# Patient Record
Sex: Female | Born: 1990 | Race: Black or African American | Hispanic: No | Marital: Single | Smoking: Never smoker
Health system: Southern US, Community
[De-identification: ages and names within clinical notes are randomized; demographics above are authoritative.]

---

## 2010-01-24 ENCOUNTER — Emergency Department (HOSPITAL_COMMUNITY): Admission: EM | Admit: 2010-01-24 | Discharge: 2010-01-24 | Payer: Self-pay | Admitting: Emergency Medicine

## 2012-09-11 ENCOUNTER — Encounter (HOSPITAL_COMMUNITY): Payer: Self-pay | Admitting: *Deleted

## 2012-09-11 ENCOUNTER — Emergency Department (HOSPITAL_COMMUNITY)
Admission: EM | Admit: 2012-09-11 | Discharge: 2012-09-11 | Disposition: A | Discharge status: Home / Self Care | Payer: Managed Care, Other (non HMO) | Attending: Emergency Medicine | Admitting: Emergency Medicine

## 2012-09-11 ENCOUNTER — Emergency Department (HOSPITAL_COMMUNITY): Payer: Managed Care, Other (non HMO)

## 2012-09-11 DIAGNOSIS — R10819 Abdominal tenderness, unspecified site: Secondary | ICD-10-CM | POA: Insufficient documentation

## 2012-09-11 DIAGNOSIS — R109 Unspecified abdominal pain: Secondary | ICD-10-CM | POA: Insufficient documentation

## 2012-09-11 DIAGNOSIS — N83209 Unspecified ovarian cyst, unspecified side: Secondary | ICD-10-CM

## 2012-09-11 DIAGNOSIS — N72 Inflammatory disease of cervix uteri: Secondary | ICD-10-CM

## 2012-09-11 LAB — URINALYSIS, ROUTINE W REFLEX MICROSCOPIC
Glucose, UA: NEGATIVE mg/dL
Nitrite: NEGATIVE
Specific Gravity, Urine: 1.019 (ref 1.005–1.030)
pH: 7 (ref 5.0–8.0)

## 2012-09-11 LAB — CBC
MCH: 25.8 pg — ABNORMAL LOW (ref 26.0–34.0)
Platelets: 331 10*3/uL (ref 150–400)
RBC: 4.45 MIL/uL (ref 3.87–5.11)
WBC: 9.1 10*3/uL (ref 4.0–10.5)

## 2012-09-11 LAB — WET PREP, GENITAL: Trich, Wet Prep: NONE SEEN

## 2012-09-11 LAB — COMPREHENSIVE METABOLIC PANEL
ALT: 8 U/L (ref 0–35)
AST: 12 U/L (ref 0–37)
CO2: 25 mEq/L (ref 19–32)
Calcium: 9.8 mg/dL (ref 8.4–10.5)
Chloride: 104 mEq/L (ref 96–112)
GFR calc non Af Amer: >90 mL/min (ref >90)
Potassium: 3.8 mEq/L (ref 3.5–5.1)
Sodium: 139 mEq/L (ref 135–145)

## 2012-09-11 LAB — URINE MICROSCOPIC-ADD ON

## 2012-09-11 MED ORDER — METRONIDAZOLE 500 MG PO TABS
500.0000 mg | ORAL_TABLET | Freq: Once | ORAL | Status: AC
  Administered 2012-09-11: 500 mg via ORAL
  Filled 2012-09-11: qty 1

## 2012-09-11 MED ORDER — DOXYCYCLINE HYCLATE 100 MG PO TABS
100.0000 mg | ORAL_TABLET | Freq: Once | ORAL | Status: AC
  Administered 2012-09-11: 100 mg via ORAL
  Filled 2012-09-11: qty 1

## 2012-09-11 MED ORDER — PROMETHAZINE HCL 25 MG PO TABS: 25.0000 mg | ORAL_TABLET | Freq: Four times a day (QID) | ORAL | Status: DC | PRN

## 2012-09-11 MED ORDER — DOXYCYCLINE HYCLATE 100 MG PO CAPS: 100.0000 mg | ORAL_CAPSULE | Freq: Two times a day (BID) | ORAL | Status: DC

## 2012-09-11 MED ORDER — CEFTRIAXONE SODIUM 250 MG IJ SOLR
250.0000 mg | Freq: Once | INTRAMUSCULAR | Status: AC
  Administered 2012-09-11: 250 mg via INTRAMUSCULAR
  Filled 2012-09-11: qty 250

## 2012-09-11 MED ORDER — METRONIDAZOLE 500 MG PO TABS: 500.0000 mg | ORAL_TABLET | Freq: Two times a day (BID) | ORAL | Status: DC

## 2012-09-11 NOTE — ED Notes (Signed)
Constant aching lower abdominal pain since Saturday 6pm. Difficulty voiding. Last BM today. Denies vaginal discharge.

## 2012-09-11 NOTE — ED Provider Notes (Signed)
History     CSN: [id number]  Arrival date & time 09/11/12  0039   First MD Initiated Contact with Patient 09/11/12 0114      Chief Complaint  Patient presents with  . Urinary Tract Infection    (Consider location/radiation/quality/duration/timing/severity/associated sxs/prior treatment) HPI Comments: [name] present ambulatory for evaluation of lower abdominal pain. The discomfort began yesterday afternoon (10/12). She thought she might be constipated and consequently took an over-the-counter laxative. She had a normal-appearing bowel movement (without blood or melena) but the discomfort was not resolved. She had mild discomfort throughout the day today (10/13) and into the evening. Prior to going to bed she urinated and had acute onset of worsening discomfort. She denies hematuria or pyuria. She denies vaginal discharges or vaginal pain. She denies any change in character of the pain.  Patient is a 21 y.o. female presenting with abdominal pain. The history is provided by the patient. No language interpreter was used.  Abdominal Pain The primary symptoms of the illness include abdominal pain. The primary symptoms of the illness do not include fever, fatigue, shortness of breath, nausea, vomiting, diarrhea, hematemesis, hematochezia, dysuria, vaginal discharge or vaginal bleeding. The current episode started yesterday. The onset of the illness was gradual. The problem has been gradually worsening.  The patient states that she believes she is currently not pregnant. The patient has not had a change in bowel habit. Symptoms associated with the illness do not include chills, anorexia, diaphoresis, heartburn, constipation, urgency, hematuria, frequency or back pain. Significant associated medical issues do not include GERD, inflammatory bowel disease, diabetes, liver disease, substance abuse, diverticulitis or cardiac disease. Associated medical issues comments: denies any significant PMHx.     History reviewed. No pertinent past medical history.  History reviewed. No pertinent past surgical history.  No family history on file.  History  Substance Use Topics  . Smoking status: Never Smoker   . Smokeless tobacco: Not on file  . Alcohol Use: Yes    OB History    Grav Para Term Preterm Abortions TAB SAB Ect Mult Living                  Review of Systems  Constitutional: Negative for fever, chills, diaphoresis and fatigue.  Respiratory: Negative for shortness of breath.   Gastrointestinal: Positive for abdominal pain. Negative for heartburn, nausea, vomiting, diarrhea, constipation, hematochezia, anorexia and hematemesis.  Genitourinary: Negative for dysuria, urgency, frequency, hematuria, flank pain, vaginal bleeding, vaginal discharge and menstrual problem (llast known menstrual period was during the last week of September. She was taking Depo-Provera  but switched to an oral contraceptive pill in September. She is not currently taking that medication.).  Musculoskeletal: Negative for back pain.  All other systems reviewed and are negative.    Allergies  Review of patient's allergies indicates no known allergies.  Home Medications  No current outpatient prescriptions on file.  BP 139/71  Pulse 65  Temp 98.5 F (36.9 C) (Oral)  Resp 18  SpO2 98%  LMP 08/26/2012  Physical Exam  Nursing note and vitals reviewed. Constitutional: She appears well-developed and well-nourished. No distress.  HENT:  Head: Normocephalic.  Right Ear: External ear normal.  Left Ear: External ear normal.  Nose: Nose normal.  Mouth/Throat: Oropharynx is clear and moist. No oropharyngeal exudate.  Eyes: Conjunctivae normal are normal. Pupils are equal, round, and reactive to light. Right eye exhibits no discharge. Left eye exhibits no discharge. No scleral icterus.  Neck: Normal range of motion.  Neck supple. No JVD present. No tracheal deviation present.  Cardiovascular: Normal  rate, regular rhythm, normal heart sounds and intact distal pulses.  Exam reveals no gallop and no friction rub.   No murmur heard. Pulmonary/Chest: Effort normal and breath sounds normal. No stridor. No respiratory distress. She has no wheezes. She has no rales. She exhibits no tenderness.  Abdominal: Soft. Bowel sounds are normal. She exhibits no shifting dullness, no distension, no ascites, no pulsatile midline mass and no mass. There is no hepatosplenomegaly. There is tenderness in the right lower quadrant and suprapubic area. There is no rigidity, no rebound, no guarding, no CVA tenderness, no tenderness at McBurney's point (Vague right lower quadrant pain without specific tenderness at McBurney's) and negative Murphy's sign. Hernia confirmed negative in the right inguinal area and confirmed negative in the left inguinal area.  Genitourinary: Uterus normal. Rectal exam shows no external hemorrhoid. Pelvic exam was performed with patient supine. No labial fusion. There is no rash, tenderness, lesion or injury on the right labia. There is no rash, tenderness, lesion or injury on the left labia. Uterus is not deviated, not enlarged, not fixed and not tender. Cervix exhibits motion tenderness and friability. Cervix exhibits no discharge. Right adnexum displays tenderness. Right adnexum displays no mass and no fullness. Left adnexum displays no mass, no tenderness and no fullness. No erythema or tenderness around the vagina. No foreign body around the vagina. No signs of injury around the vagina. Vaginal discharge found.  Musculoskeletal: Normal range of motion. She exhibits no edema and no tenderness.  Lymphadenopathy:    She has no cervical adenopathy.       Right: No inguinal adenopathy present.       Left: No inguinal adenopathy present.  Neurological: She is alert.  Skin: Skin is warm and dry. No rash noted. She is not diaphoretic. No erythema. No pallor.  Psychiatric: She has a normal mood and  affect. Her behavior is normal.    ED Course  Procedures (including critical care time)  Labs Reviewed  URINALYSIS, ROUTINE W REFLEX MICROSCOPIC - Abnormal; Notable for the following:    APPearance CLOUDY (*)     Leukocytes, UA SMALL (*)     All other components within normal limits  URINE MICROSCOPIC-ADD ON - Abnormal; Notable for the following:    Squamous Epithelial / LPF MANY (*)     Bacteria, UA FEW (*)     All other components within normal limits  PREGNANCY, URINE  GC/CHLAMYDIA PROBE AMP, GENITAL  WET PREP, GENITAL   No results found.   No diagnosis found.    MDM  Pt presents for evaluation of lowe abdominal pain.  Note stable VS, no evidence of peritonitis on exam, NAD.  Pt does have RLQ and suprapubic discomfort to palp.  U/A obtained during the triage process is negative for hematuria or a UTI.  Plan perform a pelvic exam with routine pelvic labs.  Will obtain routine belly labs and add imaging as appropriate (CT VS U/S) based on the pelvic exam.  0230.  Pt stable, NAD.  Note moderate amount of discharge and CMT with irritation/erythema noted of the cervical surface.  Will treat for cervicitis/PID.  U/S has been ordered to evaluate for ovarian cyst or tuboovarian abscess.  9604.  Pt stable, NAD.   Note ultrasound report, "Probable hemorrhagic cyst in the left ovary. Right ovary isnormal. Uterus and endometrium are normal except for fluid in the lower uterine segment endometrial canal. There is  a moderatelylarge amount of free fluid in the pelvis of uncertain etiology."  Secondary to the amt of FF noted on the U/S, will obtain a repeat H&H.  If it has remained consistent with the original lab value, will discharge home.  If there has been a significant drop in the H&H, will contact the on-call Gyn provider for further evaluation.  \  0745.  Note no significant change in pt's H&H.  Plan discharge home.           Tobin Chad, MD 09/11/12 [phone]

## 2012-09-11 NOTE — ED Notes (Signed)
C/o abd pain and back pain, onset Saturday. "have muscular pain with urination, but not burning", (denies: nvd, fever or bleeding), also reports constipation, last BM 1500.

## 2013-03-31 IMAGING — US US PELVIS COMPLETE
1 series · 13 of 25 positions shown · non-contrast
Comparison: None

CLINICAL DATA: Low pelvic pain for 3 days.



[Series 1: us pelvis complete · 0.22mm/px · 13 of 64 slices shown]
[im 1/64]
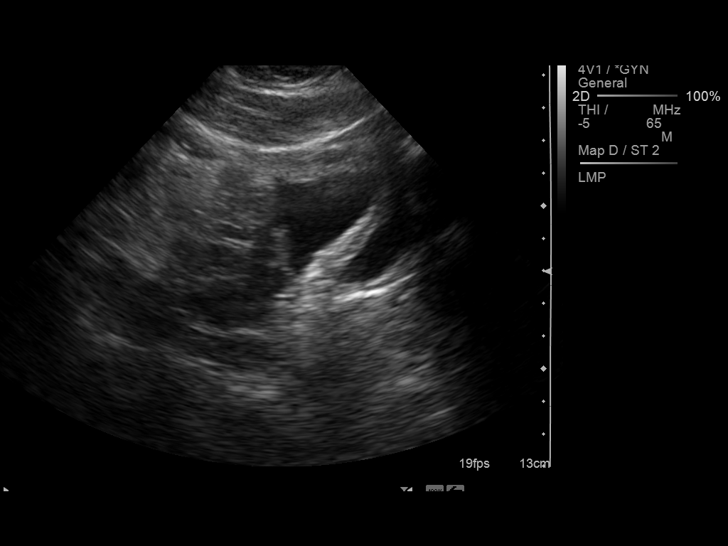
[im 6/64]
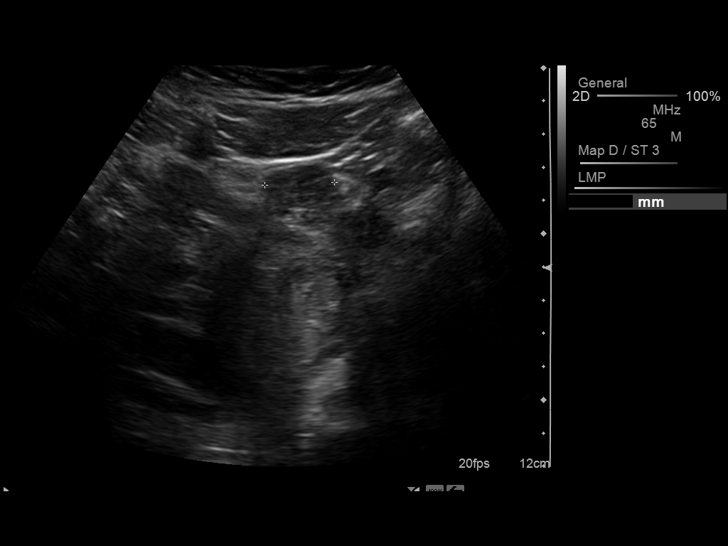
[im 11/64]
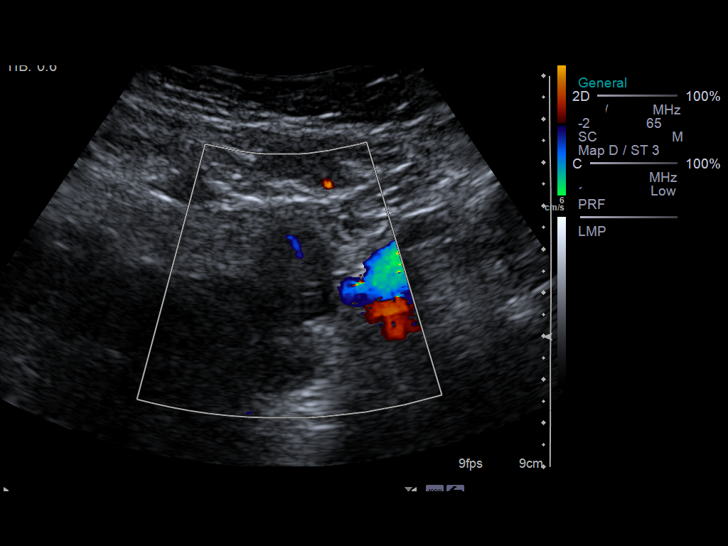
[im 16/64]
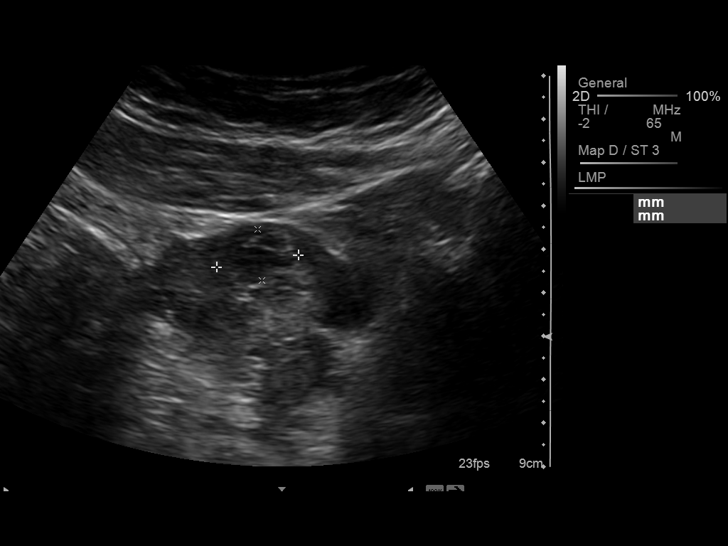
[im 22/64]
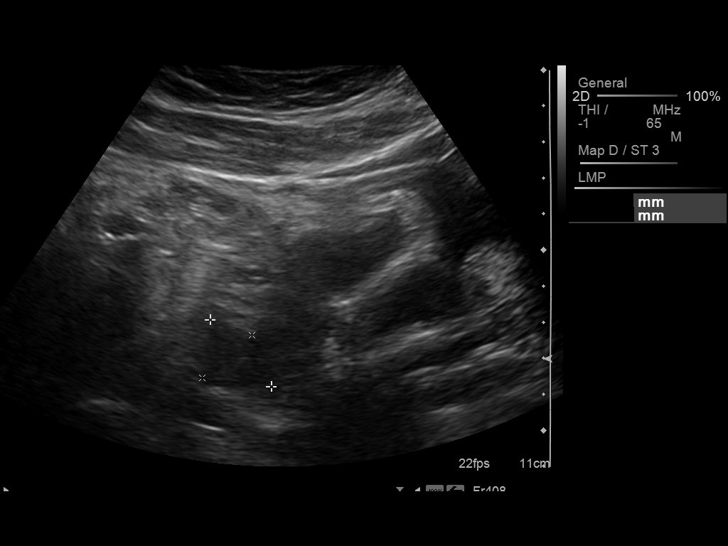
[im 27/64]
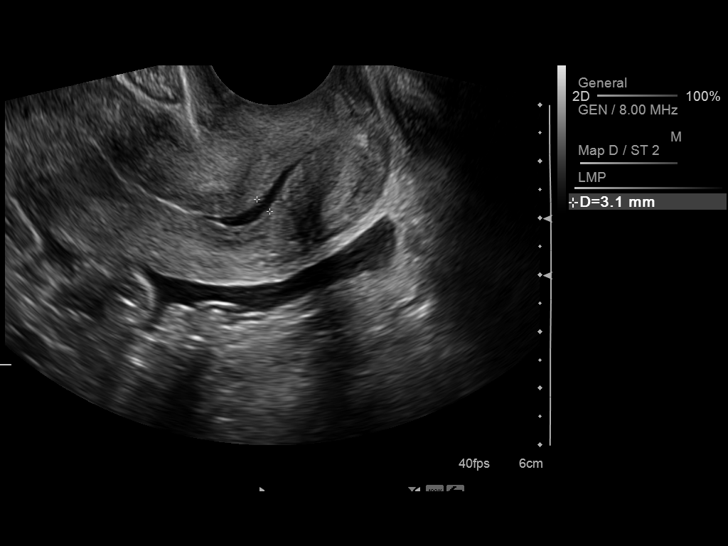
[im 32/64]
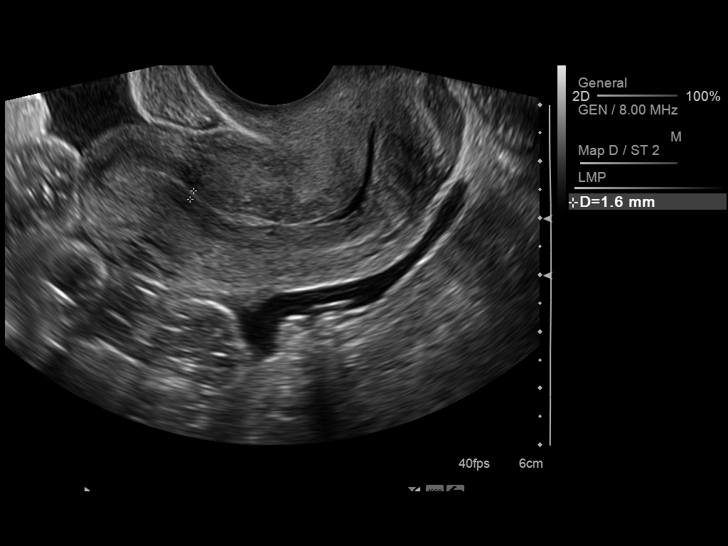
[im 37/64]
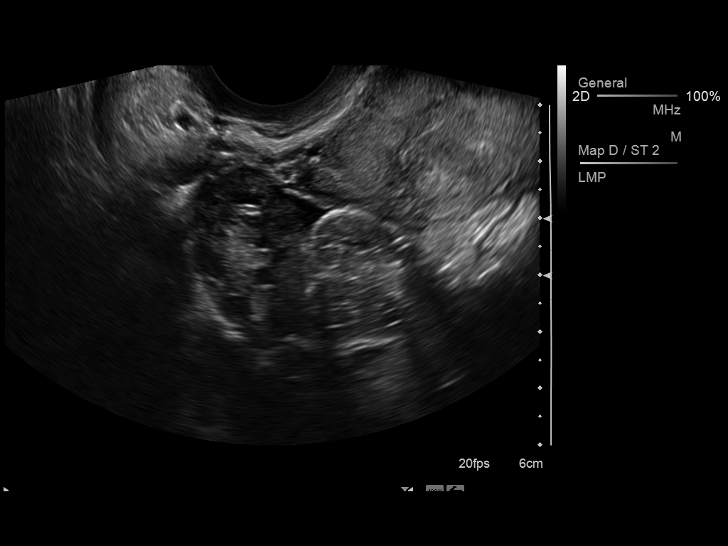
[im 43/64]
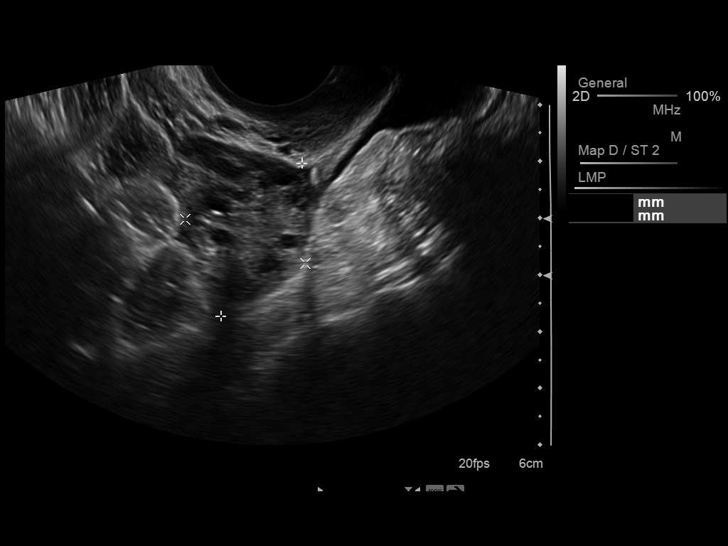
[im 48/64]
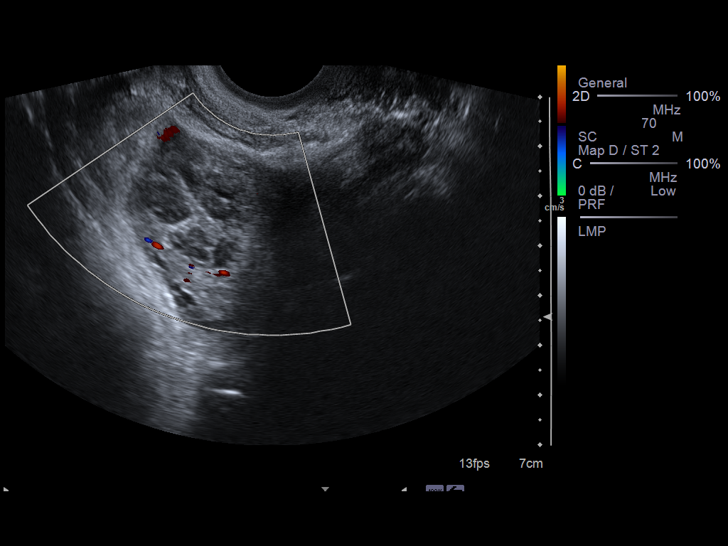
[im 53/64]
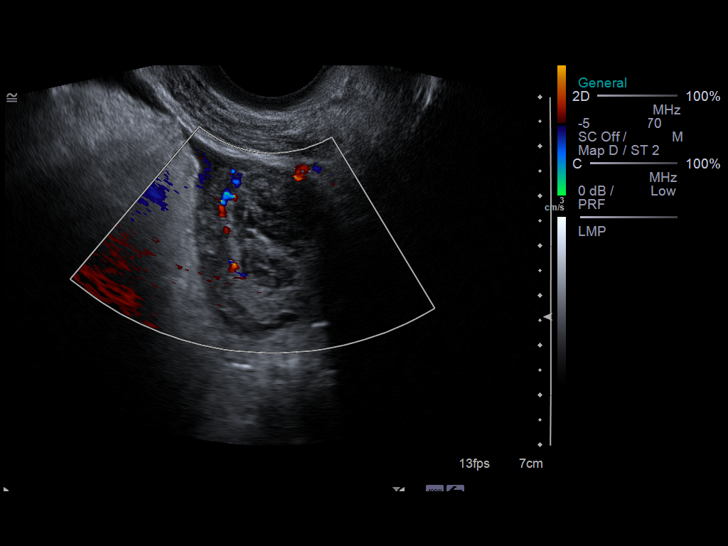
[im 58/64]
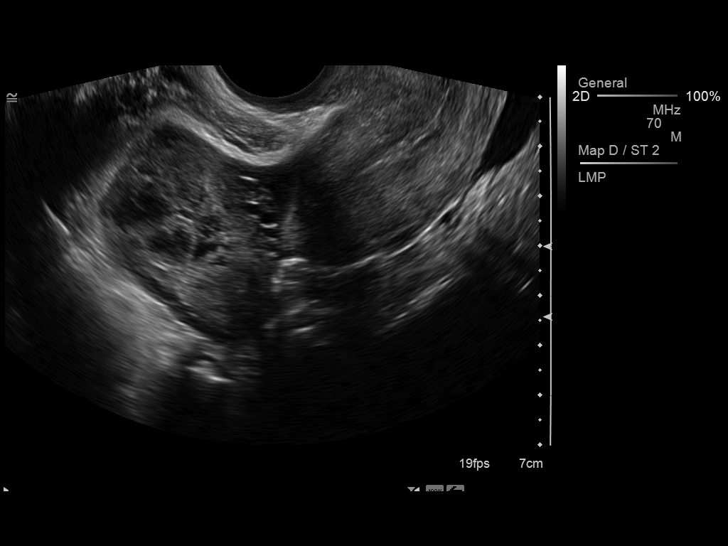
[im 64/64]
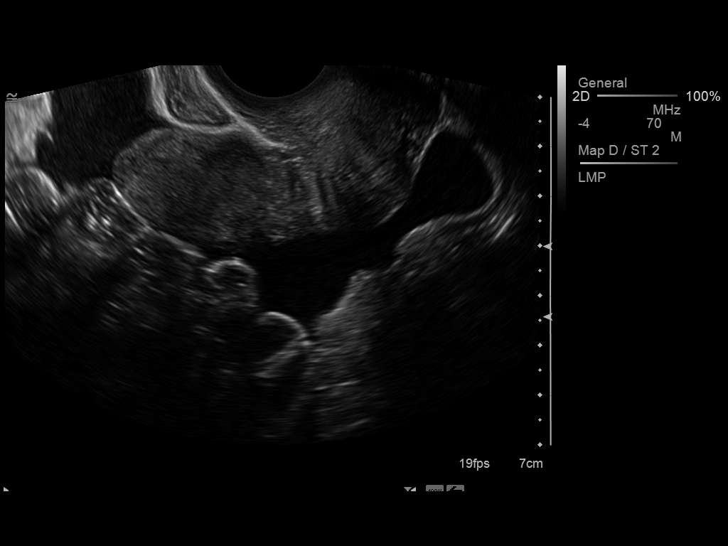

[13 of 25 positions shown; findings below may reference images not displayed]

FINDINGS: Uterus: The uterus is anteverted and measures 7.1 x 3 x 4.5 cm.  No
abnormal myometrial masses.

Endometrium: Endometrial stripe thickness is normal, measuring less
than 2 mm.  There is fluid in the lower uterine segment endometrial
canal.  This may be due to physiologic changes.

Right ovary:  Right ovary measures 3.1 x 2.3 x 2 cm.  Normal
follicular changes.  Flow is demonstrated in the right ovary on
color flow Doppler imaging.

Left ovary: The left ovary measures 5 x 3 x 2.7 cm.  There is a
central complex heterogeneously hyperechoic structure without flow
demonstrated.  This measures about 2.5 x 1.8 x 1.8 cm.  This is
likely to represent a hemorrhagic cyst.  Flow is demonstrated
within the left ovary on color flow Doppler imaging.

Other findings: There is a moderately large amount of free fluid in
the pelvis.  The etiology of this is not defined.
IMPRESSION: Probable hemorrhagic cyst in the left ovary.  Right ovary is
normal.  Uterus and endometrium are normal except for fluid in the
lower uterine segment endometrial canal.  There is a moderately
large amount of free fluid in the pelvis of uncertain etiology..

## 2013-04-15 ENCOUNTER — Encounter (HOSPITAL_COMMUNITY): Payer: Self-pay | Admitting: Family Medicine

## 2013-04-15 ENCOUNTER — Emergency Department (HOSPITAL_COMMUNITY)
Admission: EM | Admit: 2013-04-15 | Discharge: 2013-04-15 | Disposition: A | Discharge status: Home / Self Care | Payer: No Typology Code available for payment source | Attending: Emergency Medicine | Admitting: Emergency Medicine

## 2013-04-15 DIAGNOSIS — R0789 Other chest pain: Secondary | ICD-10-CM

## 2013-04-15 DIAGNOSIS — S298XXA Other specified injuries of thorax, initial encounter: Secondary | ICD-10-CM | POA: Insufficient documentation

## 2013-04-15 DIAGNOSIS — M549 Dorsalgia, unspecified: Secondary | ICD-10-CM

## 2013-04-15 DIAGNOSIS — Y92410 Unspecified street and highway as the place of occurrence of the external cause: Secondary | ICD-10-CM | POA: Insufficient documentation

## 2013-04-15 DIAGNOSIS — Y9389 Activity, other specified: Secondary | ICD-10-CM | POA: Insufficient documentation

## 2013-04-15 DIAGNOSIS — IMO0002 Reserved for concepts with insufficient information to code with codable children: Secondary | ICD-10-CM | POA: Insufficient documentation

## 2013-04-15 MED ORDER — CYCLOBENZAPRINE HCL 10 MG PO TABS: 10.0000 mg | ORAL_TABLET | Freq: Two times a day (BID) | ORAL | Status: AC | PRN

## 2013-04-15 MED ORDER — TRAMADOL HCL 50 MG PO TABS: 50.0000 mg | ORAL_TABLET | Freq: Four times a day (QID) | ORAL | Status: AC | PRN
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
## 2013-04-15 NOTE — Discharge Instructions (Signed)
Please follow [facility] with your [facility] if not improving or return to the ED if your symptoms worsen. Please take pain medication as prescribed and as needed for pain. Please do not drive on narcotic pain medication or flexeril. Please read all discharge instructions and return precautions.   Pulmonary Contusion A pulmonary contusion is a deep bruise to the lung. The lungs bring oxygen into the bloodstream and remove carbon dioxide that the body cannot use. A pulmonary contusion causes the lung tissue to swell and bleed into the surrounding area. This interferes with the ability of the lungs to function. You may feel short of breath because you are not getting enough oxygen.  CAUSES  Pulmonary contusions usually happen when there is a chest injury, such as from:  Car crashes.  Severe falls, especially from a high height.  Being near an explosion.  Sports injuries.  Job injuries.  Crush injuries, such as from industrial or farming machinery. SYMPTOMS   Shortness of breath.  Fast breathing.  Fast heart rate.  Bruising of the chest.  Chest pain.  Coughing.  Spitting blood. When the pulmonary contusion is severe, the symptoms may get worse over the first 24 to 48 hours. More serious symptoms may include:  Blueness of the lips or nail beds.  Sweating.  Feeling faint or dizzy.  Feeling confused. DIAGNOSIS  A pulmonary contusion is suspected when there is any kind of severe blow to the chest, especially when it is followed by difficulty breathing. The caregiver usually observes your breathing and checks your chest for bruised or swollen areas. Other tests may include:  Imaging tests such as:  Chest X-rays. An X-ray might not show evidence of a pulmonary contusion for 1 to 2 days after the injury, so chest X-rays may be repeated several times.  A computed tomography (CT) scan.  Magnetic resonance imaging (MRI).  Pulse oximetry. For this test, a sensor is put on  the finger to measure heart rate and the amount of oxygen in the bloodstream.  Arterial blood gases. This is a blood test that measures the amount of oxygen, carbon dioxide, and acid in the blood. TREATMENT  Treatment is often given in an emergency department. Most people with a pulmonary contusion need to be carefully checked because there may be other injuries. Treatment may include:  Taking pain medicine. People with a pulmonary contusion usually have chest pain.  Fluids given through an intravenous line (IV).  Performing breathing exercises to help with deep breathing and to avoid pneumonia. You may be asked to use a device called an incentive spirometer. This can help with deep breathing exercises.  Oxygen if there is difficulty breathing and low blood oxygen. In severe cases, you may need to have a tube placed in your throat and a machine (ventilator) to help with breathing.  Surgery if a blood vessel continues to bleed uncontrollably or if the lung has been punctured. HOME CARE INSTRUCTIONS   Only take over-the-counter or prescription medicines for pain, discomfort, or fever as directed by your caregiver. Do not take aspirin for the first few days. This may increase bruising.  Continue to do deep breathing exercises. Use the incentive spirometer if your caregiver tells you to.  Return to normal activities only when your caregiver approves. This includes driving, work, school, and sports. SEEK IMMEDIATE MEDICAL CARE IF:   You have difficulty breathing and it is getting worse.  Your chest pain gets worse.  You cough up blood.  You  start to wheeze.  Your lips or nail beds appear blue.  You have a fever.  You feel dizzy, confused, or like you will faint. MAKE SURE YOU:   Understand these instructions.  Will watch your condition.  Will get help right away if you are not doing well or get worse. Document Released: 08/24/2008 Document Revised: 02/07/2012 Document Reviewed:  10/01/2011 [facility] Patient Information 2013 [place], [place].

## 2013-04-16 NOTE — ED Provider Notes (Signed)
  Medical screening examination/treatment/procedure(s) were performed by non-physician practitioner and as supervising physician I was immediately available for consultation/collaboration.    Kemet Nijjar, MD 04/16/13 0753 

## 2018-05-09 ENCOUNTER — Emergency Department (HOSPITAL_COMMUNITY)
Admission: EM | Admit: 2018-05-09 | Discharge: 2018-05-09 | Disposition: A | Discharge status: Home / Self Care | Payer: BLUE CROSS/BLUE SHIELD | Attending: Emergency Medicine | Admitting: Emergency Medicine

## 2018-05-09 ENCOUNTER — Encounter (HOSPITAL_COMMUNITY): Payer: Self-pay

## 2018-05-09 DIAGNOSIS — W272XXA Contact with scissors, initial encounter: Secondary | ICD-10-CM | POA: Insufficient documentation

## 2018-05-09 DIAGNOSIS — Y998 Other external cause status: Secondary | ICD-10-CM | POA: Insufficient documentation

## 2018-05-09 DIAGNOSIS — S61216A Laceration without foreign body of right little finger without damage to nail, initial encounter: Secondary | ICD-10-CM

## 2018-05-09 DIAGNOSIS — Y929 Unspecified place or not applicable: Secondary | ICD-10-CM | POA: Diagnosis not present

## 2018-05-09 DIAGNOSIS — Y9389 Activity, other specified: Secondary | ICD-10-CM | POA: Diagnosis not present

## 2018-05-09 NOTE — ED Notes (Signed)
Wound care performed to rt pinky finger

## 2018-05-09 NOTE — Discharge Instructions (Signed)
Keep wound clean with mild soap and water. Keep area covered with a topical antibiotic ointment and bandage, keep bandage dry, and do not submerge in water for 24 hours. Ice and elevate for additional pain and swelling relief. Alternate between Ibuprofen and Tylenol for additional pain relief. Follow up with your primary care doctor or [facility] in approximately 5-7 days for wound recheck. Monitor area for signs of infection to include, but not limited to: increasing pain, spreading redness, drainage/pus, worsening swelling, or fevers. Return to emergency department for emergent changing or worsening symptoms.

## 2018-05-09 NOTE — ED Provider Notes (Addendum)
[facility]-EMERGENCY DEPT Provider Note   CSN: [id number] Arrival date & time: 05/09/18  2038     History   Chief Complaint Chief Complaint  Patient presents with  . finger laceration    HPI Marilyn Miller is a 27 y.o. otherwise healthy R-handed female, who presents to the ED with complaints of right pinky finger laceration sustained about 1.5 hours ago.  Patient states that she was helping her cousin who was trying to cut some balloons, and her cousin accidentally cut her right pinky finger with scissors.  She sustained a laceration to the fingertip, she is not sure whether it cut part of the skin off or not, states that the bleeding has been controlled with pressure however it bled enough that she felt like she needed to come here.  She complains of 5/10 constant throbbing nonradiating pain to the right pinky fingertip, worse with palpation or movement of the area, and with no treatments tried prior to arrival.  She does not have any bleeding disorders nor does she take blood thinners.  She had a tetanus shot this year.  She denies any numbness, tingling, focal weakness, or any other complaints at this time.  Of note, she is currently on amoxicillin for sinusitis.  The history is provided by the patient and medical records. No language interpreter was used.    History reviewed. No pertinent past medical history.  There are no active problems to display for this patient.   History reviewed. No pertinent surgical history.   OB History   None      Home Medications    Prior to Admission medications   Medication Sig Start Date End Date Taking? Authorizing Provider  cyclobenzaprine (FLEXERIL) 10 MG tablet Take 1 tablet (10 mg total) by mouth 2 (two) times daily as needed for muscle spasms. 04/15/13   Piepenbrink, Victorino DikeJennifer, PA-C  traMADol (ULTRAM) 50 MG tablet Take 1 tablet (50 mg total) by mouth every 6 (six) hours as needed for pain. 04/15/13   Piepenbrink,  Victorino DikeJennifer, PA-C    Family History History reviewed. No pertinent family history.  Social History Social History   Tobacco Use  . Smoking status: Never Smoker  . Smokeless tobacco: Never Used  Substance Use Topics  . Alcohol use: Yes  . Drug use: No     Allergies   Patient has no known allergies.   Review of Systems Review of Systems  Musculoskeletal: Positive for myalgias. Negative for arthralgias.  Skin: Positive for wound.  Allergic/Immunologic: Negative for immunocompromised state.  Neurological: Negative for weakness and numbness.  Hematological: Does not bruise/bleed easily.  Psychiatric/Behavioral: Negative for confusion.     Physical Exam Updated Vital Signs BP (!) 161/85 (BP Location: Left Arm)   Pulse 64   Temp 98.2 F (36.8 C) (Oral)   Resp 20   LMP 04/17/2018   SpO2 95%   Physical Exam  Constitutional: She is oriented to person, place, and time. Vital signs are normal. She appears well-developed and well-nourished.  Non-toxic appearance. No distress.  Afebrile, nontoxic, NAD  HENT:  Head: Normocephalic and atraumatic.  Mouth/Throat: Mucous membranes are normal.  Eyes: Conjunctivae and EOM are normal. Right eye exhibits no discharge. Left eye exhibits no discharge.  Neck: Normal range of motion. Neck supple.  Cardiovascular: Normal rate and intact distal pulses.  Pulmonary/Chest: Effort normal. No respiratory distress.  Abdominal: Normal appearance. She exhibits no distension.  Musculoskeletal: Normal range of motion.       Right  hand: She exhibits tenderness and laceration. She exhibits normal range of motion, no bony tenderness, normal capillary refill, no deformity and no swelling. Normal sensation noted. Normal strength noted.  R hand with FROM intact in all joints of the digits, including DIP/PIP joints of the 5th finger, with small superficial laceration to palmar aspect of the 5th fingertip with missing flap of skin to this area, leaving a  circular wound, oozing bleeding which is controlled with pressure and no arterial bleeding noted, no retained FBs, doesn't extend to underlying tendons/bone, mild TTP to the wound but otherwise no focal bony/joint line TTP to remainder of finger/hand. No swelling or bruising, no crepitus or deformity. No extension to nailbed. Strength and sensation grossly intact, distal pulses intact, cap refill brisk and present, soft compartments.   Neurological: She is alert and oriented to person, place, and time. She has normal strength. No sensory deficit.  Skin: Skin is warm and dry. Laceration noted. No rash noted.  R pinky finger lac as mentioned above  Psychiatric: She has a normal mood and affect. Her behavior is normal.  Nursing note and vitals reviewed.    ED Treatments / Results  Labs (all labs ordered are listed, but only abnormal results are displayed) Labs Reviewed - No data to display  EKG None  Radiology No results found.  Procedures .Marland KitchenLaceration Repair Date/Time: 05/09/2018 9:36 PM Performed by: Rhona Raider, PA-C Authorized by: Rhona Raider, [place]   Consent:    Consent obtained:  Verbal   Consent given by:  Patient   Risks discussed:  Pain   Alternatives discussed:  No treatment Anesthesia (see MAR for exact dosages):    Anesthesia method:  None Laceration details:    Location:  Finger   Finger location:  R small finger   Length (cm):  1 Repair type:    Repair type:  Simple Exploration:    Hemostasis achieved with:  Direct pressure   Wound exploration: wound explored through full range of motion and entire depth of wound probed and visualized     Wound extent: no foreign bodies/material noted     Contaminated: no   Treatment:    Area cleansed with:  Saline   Amount of cleaning:  Standard   Irrigation solution:  Sterile saline   Irrigation method:  Syringe Skin repair:    Repair method: quick clot. Post-procedure details:    Dressing:  Non-adherent  dressing and bulky dressing   Patient tolerance of procedure:  Tolerated well, no immediate complications   (including critical care time)  Medications Ordered in ED Medications - No data to display   Initial Impression / Assessment and Plan / ED Course  I have reviewed the triage vital signs and the nursing notes.  Pertinent labs & imaging results that were available during my care of the patient were reviewed by me and considered in my medical decision making (see chart for details).     27 y.o. female here with R pinky finger laceration sustained ~1.5hrs PTA when her cousin accidentally snipped her fingertip with scissors. On exam, FROM intact in all joints of the R pinky finger, small flap of skin of the palmar surface of the fingertip has been avulsed off, oozing bleeding from capillary bed but no arterial bleed, wound fairly superficial and doesn't extend to underlying bone or tendons, NVI with soft compartments. Not amendable to sutures or dermabond given that flap of skin is missing, quick clot applied and bleeding easily controlled, wound dressed with  nonadherent bandage. Doubt need for xray imaging, no retained FBs visualized on exam, and doubt underlying fx. Doubt need for ppx abx, pt already on amoxicillin which would cover for many skin bacteria anyway. Advised RICE, tylenol/motrin for pain, and proper wound care. F/up with PCP in 5-7 days for recheck. I explained the diagnosis and have given explicit precautions to return to the ER including for any other new or worsening symptoms. The patient understands and accepts the medical plan as it's been dictated and I have answered their questions. Discharge instructions concerning home care and prescriptions have been given. The patient is STABLE and is discharged to home in good condition.    Final Clinical Impressions(s) / ED Diagnoses   Final diagnoses:  Laceration of right little finger without foreign body without damage to nail,  initial encounter    ED Discharge Orders    [address] [place], [place] 05/09/18 2145    Bethann Berkshire, MD 05/09/18 2151
